# Patient Record
Sex: Female | Born: 1975 | Race: Black or African American | Hispanic: No | Smoking: Current every day smoker
Health system: Southern US, Community
[De-identification: ages and names within clinical notes are randomized; demographics above are authoritative.]

---

## 2004-09-05 ENCOUNTER — Emergency Department (HOSPITAL_COMMUNITY): Admission: EM | Admit: 2004-09-05 | Discharge: 2004-09-05 | Payer: Self-pay | Admitting: Emergency Medicine

## 2005-03-01 ENCOUNTER — Emergency Department (HOSPITAL_COMMUNITY): Admission: EM | Admit: 2005-03-01 | Discharge: 2005-03-01 | Payer: Self-pay | Admitting: Emergency Medicine

## 2005-03-03 ENCOUNTER — Emergency Department (HOSPITAL_COMMUNITY): Admission: EM | Admit: 2005-03-03 | Discharge: 2005-03-03 | Payer: Self-pay | Admitting: Emergency Medicine

## 2007-06-27 ENCOUNTER — Emergency Department (HOSPITAL_COMMUNITY): Admission: EM | Admit: 2007-06-27 | Discharge: 2007-06-27 | Payer: Self-pay | Admitting: Emergency Medicine

## 2007-07-12 ENCOUNTER — Emergency Department (HOSPITAL_COMMUNITY): Admission: EM | Admit: 2007-07-12 | Discharge: 2007-07-13 | Payer: Self-pay | Admitting: Emergency Medicine

## 2007-07-19 ENCOUNTER — Emergency Department (HOSPITAL_COMMUNITY): Admission: EM | Admit: 2007-07-19 | Discharge: 2007-07-19 | Payer: Self-pay | Admitting: Emergency Medicine

## 2007-11-02 ENCOUNTER — Emergency Department (HOSPITAL_COMMUNITY): Admission: EM | Admit: 2007-11-02 | Discharge: 2007-11-02 | Payer: Self-pay | Admitting: Emergency Medicine

## 2010-03-22 ENCOUNTER — Emergency Department (HOSPITAL_COMMUNITY)
Admission: EM | Admit: 2010-03-22 | Discharge: 2010-03-22 | Payer: Self-pay | Source: Home / Self Care | Admitting: Emergency Medicine

## 2010-09-11 ENCOUNTER — Emergency Department (HOSPITAL_COMMUNITY)
Admission: EM | Admit: 2010-09-11 | Discharge: 2010-09-11 | Payer: Self-pay | Source: Home / Self Care | Admitting: Emergency Medicine

## 2010-09-17 ENCOUNTER — Emergency Department (HOSPITAL_COMMUNITY)
Admission: EM | Admit: 2010-09-17 | Discharge: 2010-09-17 | Payer: Self-pay | Source: Home / Self Care | Admitting: Emergency Medicine

## 2010-09-17 LAB — URINALYSIS, ROUTINE W REFLEX MICROSCOPIC
Bilirubin Urine: NEGATIVE
Hgb urine dipstick: NEGATIVE
Ketones, ur: NEGATIVE mg/dL
Nitrite: NEGATIVE
Urobilinogen, UA: 0.2 mg/dL (ref 0.0–1.0)

## 2010-09-17 LAB — DIFFERENTIAL
Basophils Absolute: 0 10*3/uL (ref 0.0–0.1)
Eosinophils Relative: 1 % (ref 0–5)
Lymphocytes Relative: 51 % — ABNORMAL HIGH (ref 12–46)
Monocytes Absolute: 0.5 10*3/uL (ref 0.1–1.0)
Monocytes Relative: 7 % (ref 3–12)

## 2010-09-17 LAB — BASIC METABOLIC PANEL
BUN: 9 mg/dL (ref 6–23)
CO2: 31 mEq/L (ref 19–32)
Calcium: 9 mg/dL (ref 8.4–10.5)
Creatinine, Ser: 0.9 mg/dL (ref 0.4–1.2)
GFR calc non Af Amer: >60 mL/min (ref >60)
Glucose, Bld: 78 mg/dL (ref 70–99)
Sodium: 139 mEq/L (ref 135–145)

## 2010-09-17 LAB — CBC
HCT: 41.3 % (ref 36.0–46.0)
Hemoglobin: 14.2 g/dL (ref 12.0–15.0)
MCH: 30.3 pg (ref 26.0–34.0)
MCHC: 34.4 g/dL (ref 30.0–36.0)
MCV: 88.1 fL (ref 78.0–100.0)
RDW: 12.9 % (ref 11.5–15.5)

## 2010-11-25 ENCOUNTER — Emergency Department (HOSPITAL_COMMUNITY): Payer: BC Managed Care – PPO

## 2010-11-25 ENCOUNTER — Emergency Department (HOSPITAL_COMMUNITY)
Admission: EM | Admit: 2010-11-25 | Discharge: 2010-11-25 | Disposition: A | Discharge status: Home / Self Care | Payer: BC Managed Care – PPO | Attending: Emergency Medicine | Admitting: Emergency Medicine

## 2010-11-25 DIAGNOSIS — R109 Unspecified abdominal pain: Secondary | ICD-10-CM | POA: Insufficient documentation

## 2010-11-25 DIAGNOSIS — N201 Calculus of ureter: Secondary | ICD-10-CM | POA: Insufficient documentation

## 2010-11-25 LAB — COMPREHENSIVE METABOLIC PANEL
Albumin: 3.7 g/dL (ref 3.5–5.2)
Alkaline Phosphatase: 62 U/L (ref 39–117)
BUN: 11 mg/dL (ref 6–23)
Chloride: 101 mEq/L (ref 96–112)
Creatinine, Ser: 1.06 mg/dL (ref 0.4–1.2)
Glucose, Bld: 112 mg/dL — ABNORMAL HIGH (ref 70–99)
Potassium: 3.7 mEq/L (ref 3.5–5.1)
Total Bilirubin: 0.5 mg/dL (ref 0.3–1.2)

## 2010-11-25 LAB — DIFFERENTIAL
Eosinophils Absolute: 0 10*3/uL (ref 0.0–0.7)
Eosinophils Relative: 1 % (ref 0–5)
Lymphocytes Relative: 32 % (ref 12–46)
Lymphs Abs: 2 10*3/uL (ref 0.7–4.0)
Monocytes Relative: 8 % (ref 3–12)

## 2010-11-25 LAB — CBC
HCT: 46 % (ref 36.0–46.0)
MCH: 30.2 pg (ref 26.0–34.0)
MCV: 90.2 fL (ref 78.0–100.0)
Platelets: 222 10*3/uL (ref 150–400)
RBC: 5.1 MIL/uL (ref 3.87–5.11)
RDW: 12.6 % (ref 11.5–15.5)
WBC: 6.2 10*3/uL (ref 4.0–10.5)

## 2010-11-25 LAB — URINALYSIS, ROUTINE W REFLEX MICROSCOPIC
Leukocytes, UA: NEGATIVE
Nitrite: NEGATIVE
Protein, ur: NEGATIVE mg/dL
Specific Gravity, Urine: >1.03 — ABNORMAL HIGH (ref 1.005–1.030)
Urobilinogen, UA: 0.2 mg/dL (ref 0.0–1.0)

## 2010-11-25 LAB — URINE MICROSCOPIC-ADD ON

## 2010-11-25 LAB — POCT PREGNANCY, URINE: Preg Test, Ur: NEGATIVE

## 2010-12-03 ENCOUNTER — Ambulatory Visit (HOSPITAL_COMMUNITY)
Admission: RE | Admit: 2010-12-03 | Discharge: 2010-12-03 | Disposition: A | Discharge status: Home / Self Care | Payer: BC Managed Care – PPO | Source: Ambulatory Visit | Attending: Urology | Admitting: Urology

## 2010-12-03 ENCOUNTER — Other Ambulatory Visit (HOSPITAL_COMMUNITY): Payer: Self-pay | Admitting: Urology

## 2010-12-03 DIAGNOSIS — R109 Unspecified abdominal pain: Secondary | ICD-10-CM | POA: Insufficient documentation

## 2010-12-03 DIAGNOSIS — N201 Calculus of ureter: Secondary | ICD-10-CM

## 2011-05-13 LAB — URINALYSIS, ROUTINE W REFLEX MICROSCOPIC
Bilirubin Urine: NEGATIVE
Glucose, UA: NEGATIVE
Hgb urine dipstick: NEGATIVE
Ketones, ur: 15 — AB
pH: 7

## 2011-05-13 LAB — URINE MICROSCOPIC-ADD ON

## 2013-01-15 IMAGING — CT CT ABD-PELV W/O CM
2 of 3 series · 18 of 46 positions shown, 20 images · non-contrast
Comparison: None

CLINICAL DATA: Right flank pain and hematuria

CT ABDOMEN AND PELVIS WITHOUT CONTRAST
TECHNIQUE: Multidetector CT imaging of the abdomen and pelvis was
performed following the standard protocol without intravenous
contrast.

[Series 2: standard/full over (age)lbs 5.0 · axial · 0.84mm/px · z∈[-412,-18]mm · 15 of 88 slices shown, 17 images]
[im 6/88  soft-tissue]
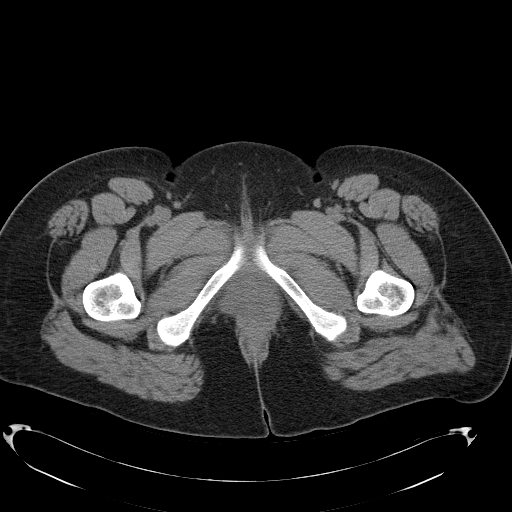
[im 6/88  bone]
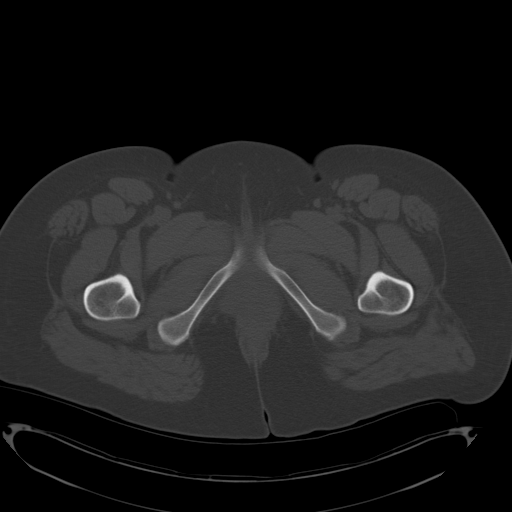
[im 12/88  soft-tissue]
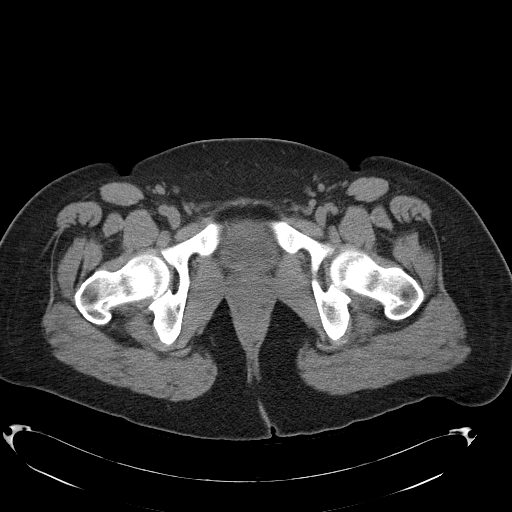
[im 17/88  soft-tissue]
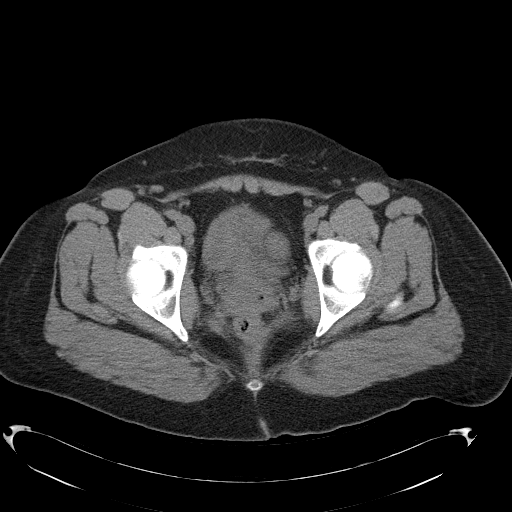
[im 23/88  soft-tissue]
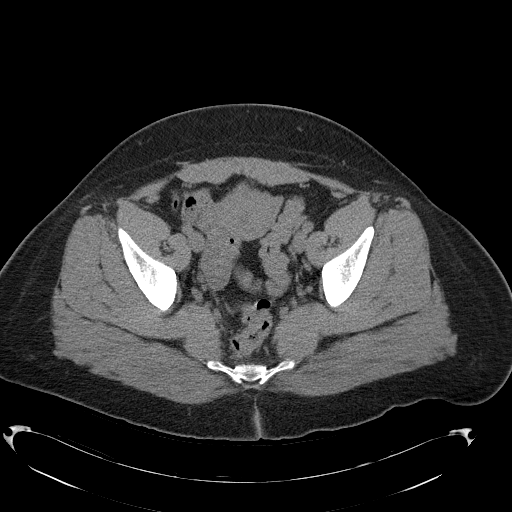
[im 29/88  soft-tissue]
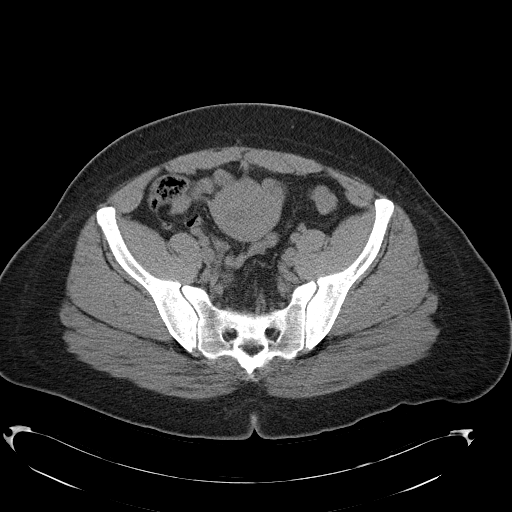
[im 34/88  soft-tissue]
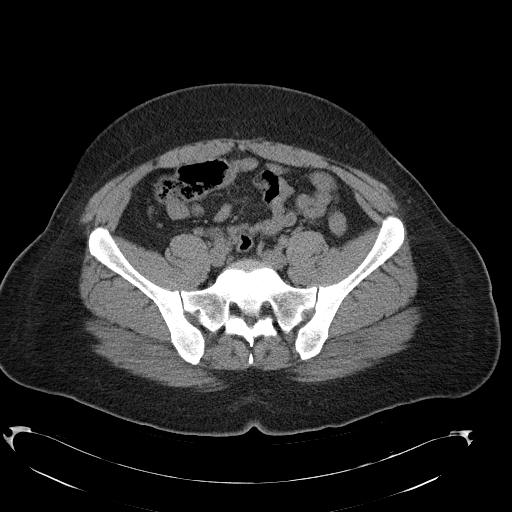
[im 40/88  soft-tissue]
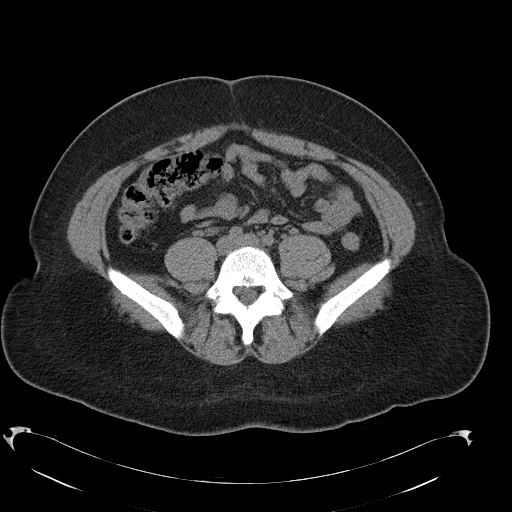
[im 45/88  soft-tissue]
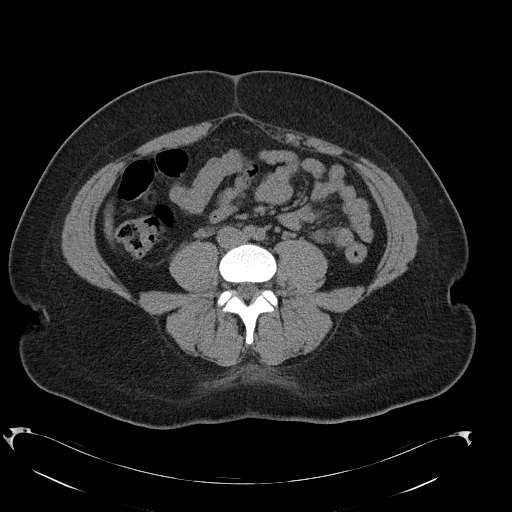
[im 51/88  soft-tissue]
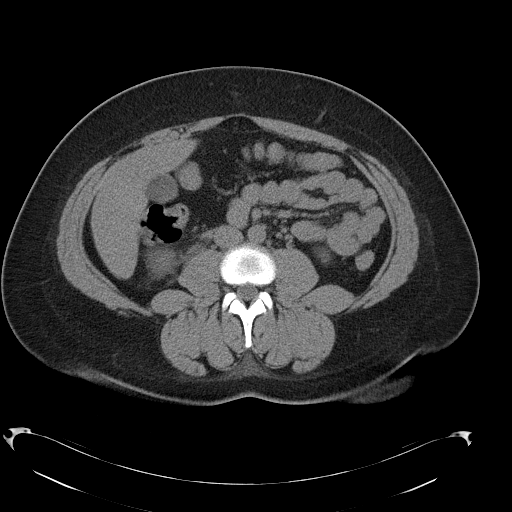
[im 51/88  bone]
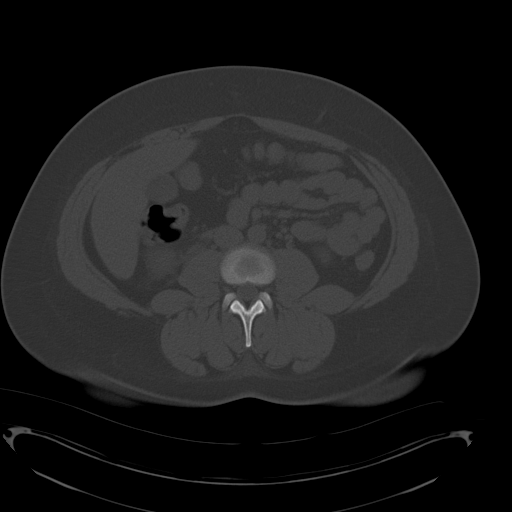
[im 57/88  soft-tissue]
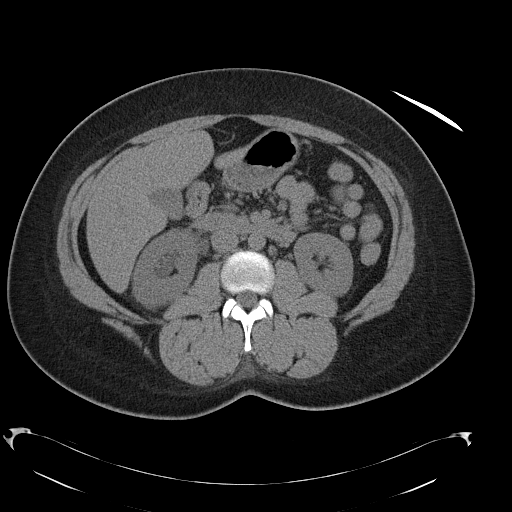
[im 62/88  soft-tissue]
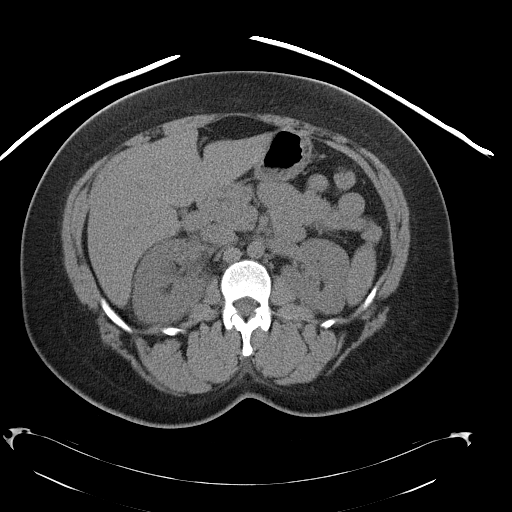
[im 68/88  soft-tissue]
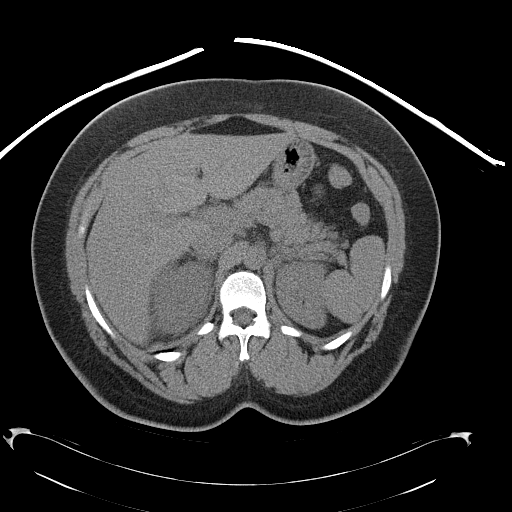
[im 73/88  soft-tissue]
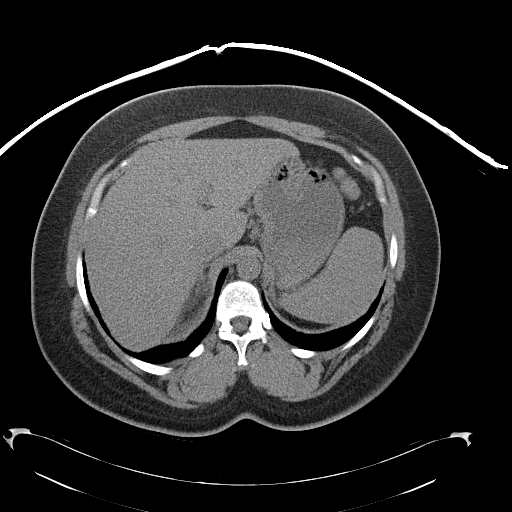
[im 79/88  soft-tissue]
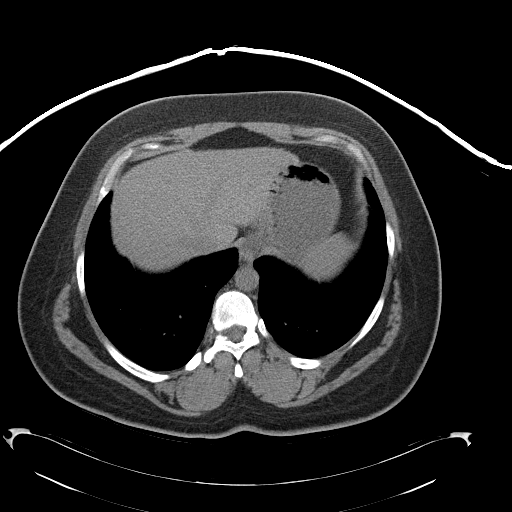
[im 85/88  soft-tissue]
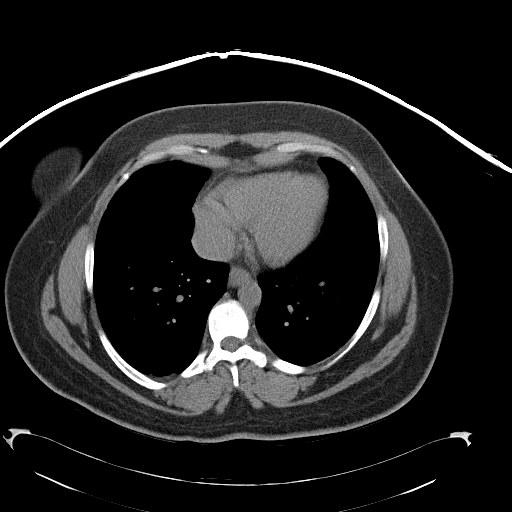

[Series 4: mpr coronal · coronal · 0.86mm/px · 3 of 86 slices shown]
[im 29/86  soft-tissue]
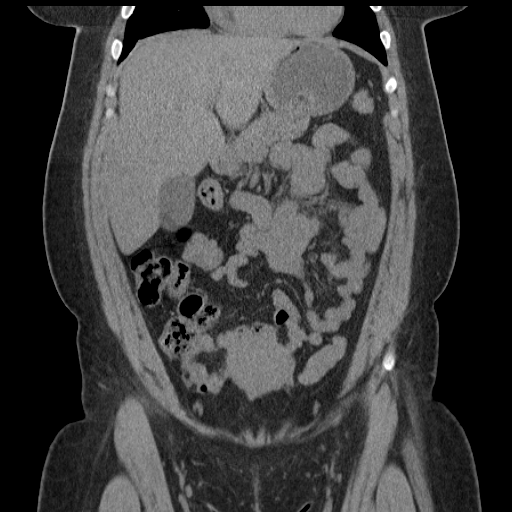
[im 38/86  soft-tissue]
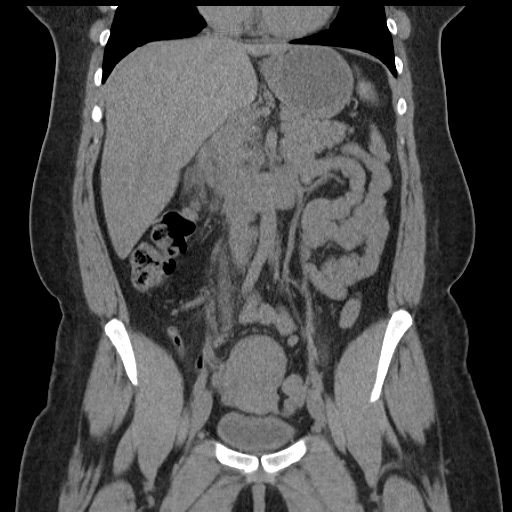
[im 48/86  soft-tissue]
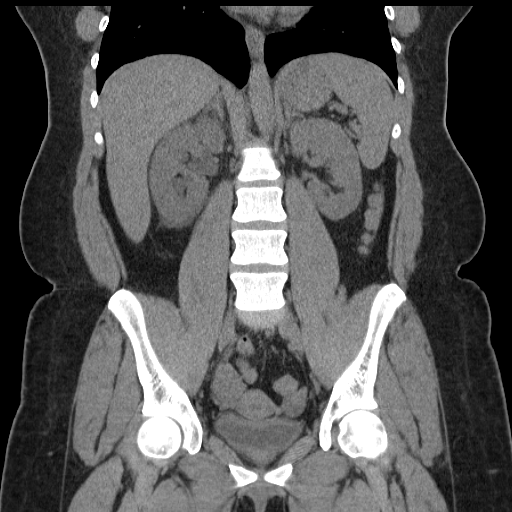

[18 of 46 positions shown; findings below may reference images not displayed]

FINDINGS: Negative for renal calculi.  Mild right hydronephrosis
and perinephric edema due to a distal ureteral stone.  3 x 4 mm
stone at the right UVJ causing obstruction.  No calculi on the
left.

Lung bases are clear.  Liver gallbladder and bile ducts are normal.
Pancreas and spleen are normal.  The bowel appears normal.
Appendix is normal.
IMPRESSION: 3 x 4 mm stone in the distal right ureter causing partial
obstruction of the right kidney.  Otherwise negative.

## 2013-01-23 IMAGING — CR DG ABDOMEN 1V
2 series · 2 of 2 positions shown · non-contrast
Comparison: CT 11/25/2010

CLINICAL DATA: Right ureteral calculus at ureterovesicle junction,
follow-up

ABDOMEN - 1 VIEW

[view not recorded (1 of 2)]
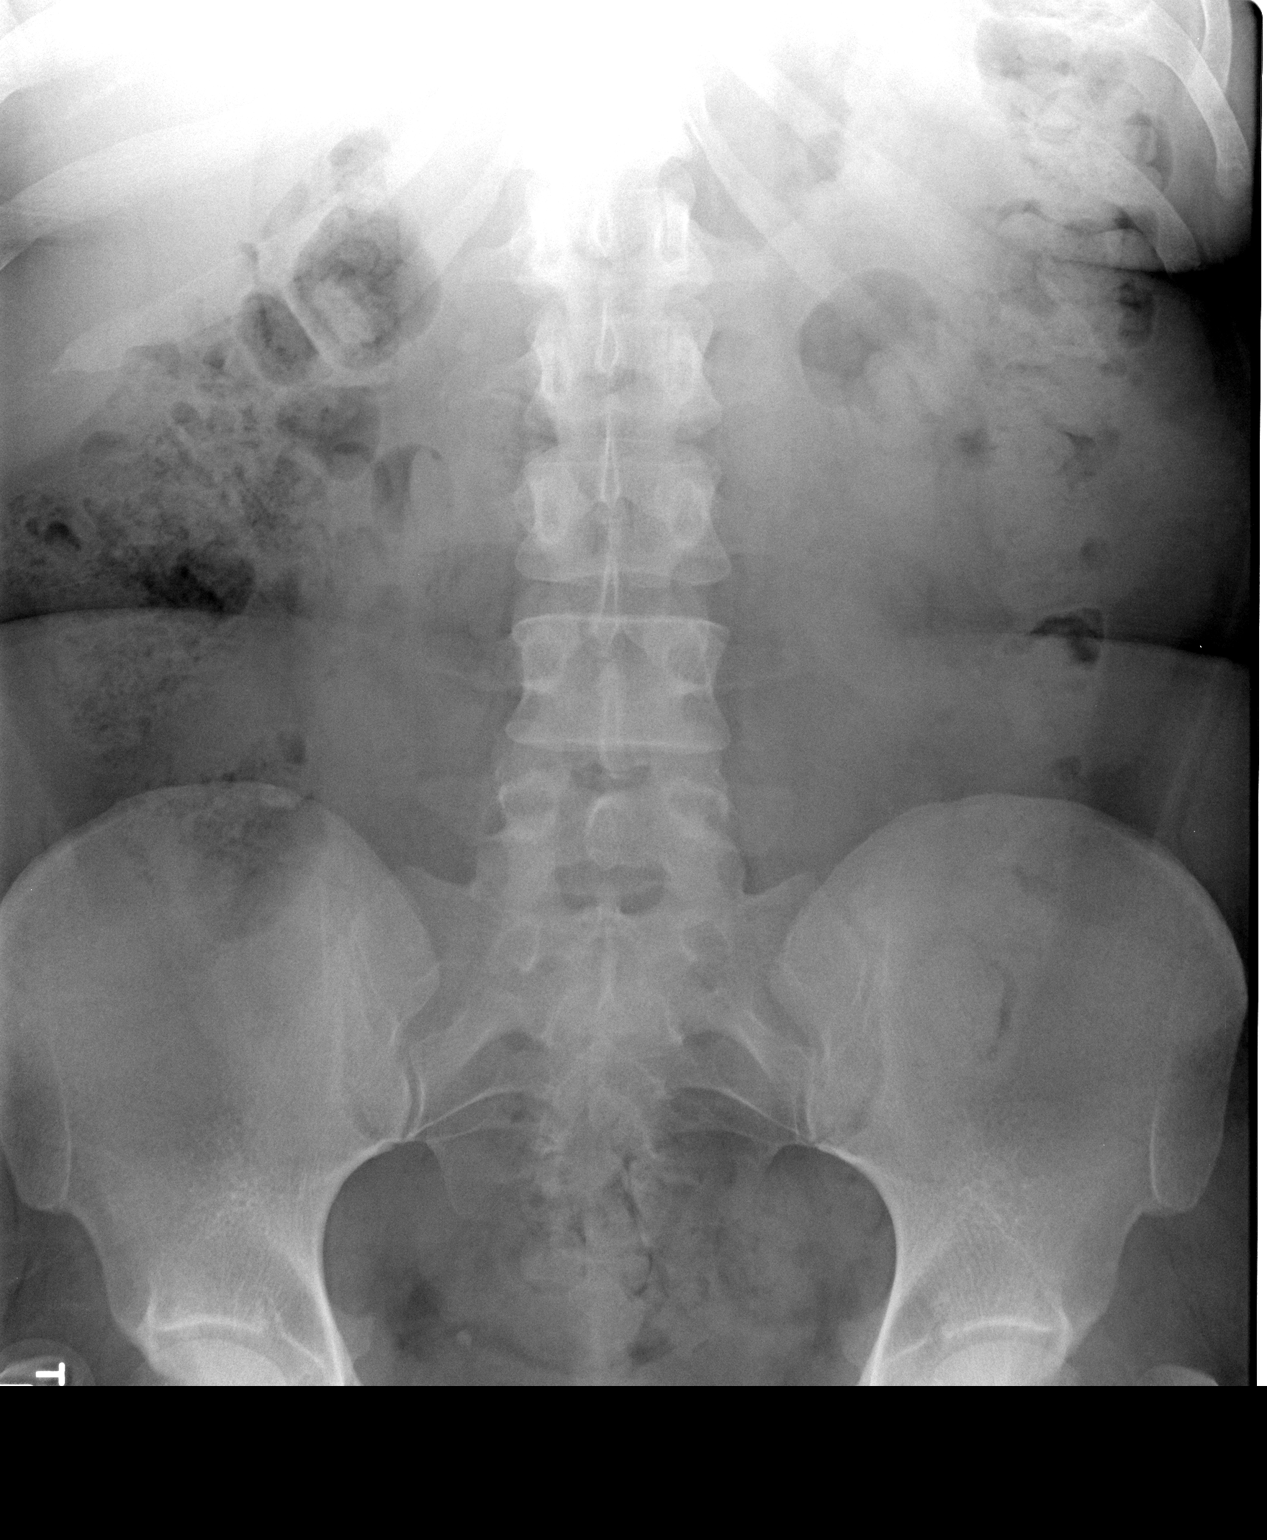

[view not recorded (2 of 2)]
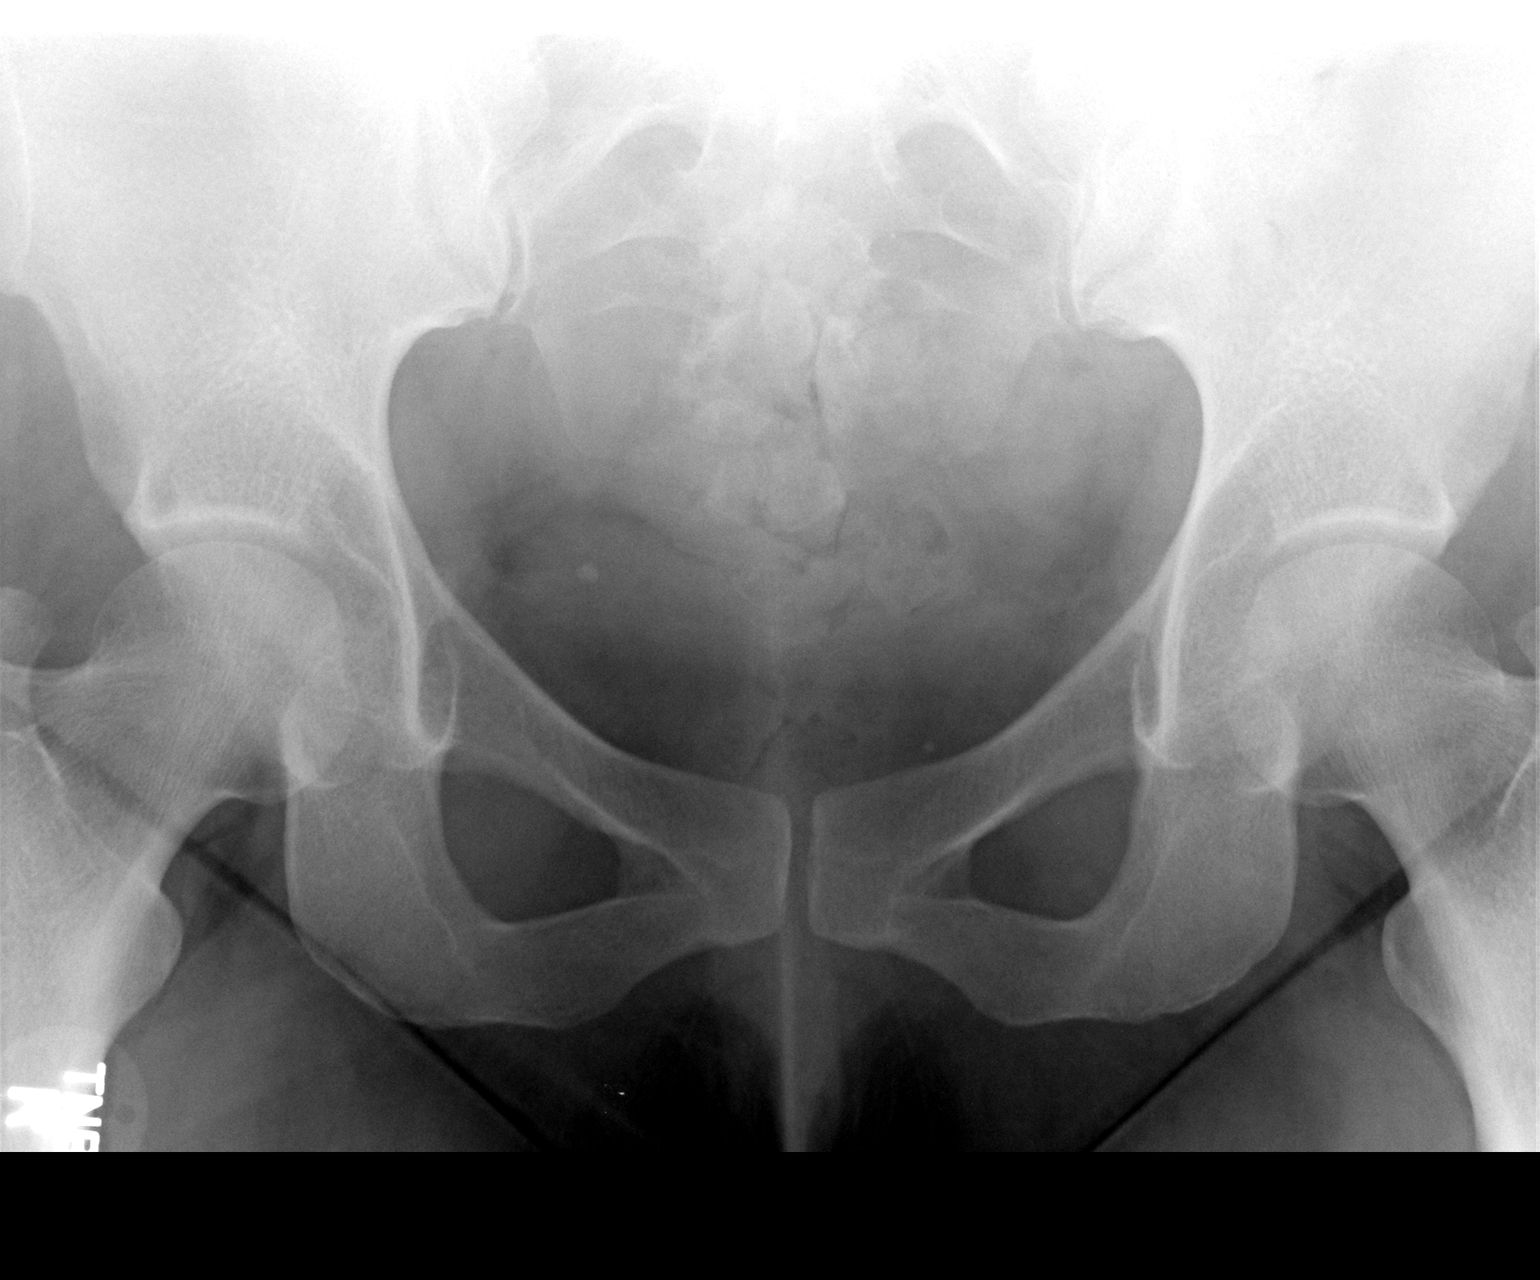

[2 of 2 positions shown; findings below may reference images not displayed]

FINDINGS: Right pelvic phleboliths seen on prior CT is again identified on
radiograph.
Distal right ureteral calcification seen on prior CT scout image is
no longer visualized.
Tiny left pelvic phlebolith is stable.
No definite urinary tract calcification identified.
Minimally increased stool in colon.
Small bowel gas pattern normal.
No acute osseous abnormalities.
IMPRESSION: Previously identified distal right ureteral calculus at right
ureterovesicle junction is no longer seen.

## 2017-03-01 ENCOUNTER — Encounter (HOSPITAL_COMMUNITY): Payer: Self-pay | Admitting: Emergency Medicine

## 2017-03-01 ENCOUNTER — Emergency Department (HOSPITAL_COMMUNITY)
Admission: EM | Admit: 2017-03-01 | Discharge: 2017-03-01 | Disposition: A | Discharge status: Home / Self Care | Payer: Managed Care, Other (non HMO) | Attending: Emergency Medicine | Admitting: Emergency Medicine

## 2017-03-01 DIAGNOSIS — Y92009 Unspecified place in unspecified non-institutional (private) residence as the place of occurrence of the external cause: Secondary | ICD-10-CM | POA: Insufficient documentation

## 2017-03-01 DIAGNOSIS — W0110XA Fall on same level from slipping, tripping and stumbling with subsequent striking against unspecified object, initial encounter: Secondary | ICD-10-CM | POA: Insufficient documentation

## 2017-03-01 DIAGNOSIS — Y939 Activity, unspecified: Secondary | ICD-10-CM | POA: Diagnosis not present

## 2017-03-01 DIAGNOSIS — S01112A Laceration without foreign body of left eyelid and periocular area, initial encounter: Secondary | ICD-10-CM | POA: Diagnosis present

## 2017-03-01 DIAGNOSIS — F17210 Nicotine dependence, cigarettes, uncomplicated: Secondary | ICD-10-CM | POA: Diagnosis not present

## 2017-03-01 DIAGNOSIS — Y999 Unspecified external cause status: Secondary | ICD-10-CM | POA: Insufficient documentation

## 2017-03-01 DIAGNOSIS — H1132 Conjunctival hemorrhage, left eye: Secondary | ICD-10-CM | POA: Insufficient documentation

## 2017-03-01 MED ORDER — FLUORESCEIN SODIUM 0.6 MG OP STRP
ORAL_STRIP | OPHTHALMIC | Status: AC
  Administered 2017-03-01: 1 via OPHTHALMIC
  Filled 2017-03-01: qty 1

## 2017-03-01 MED ORDER — POVIDONE-IODINE 10 % EX SOLN
CUTANEOUS | Status: AC
  Filled 2017-03-01: qty 118

## 2017-03-01 MED ORDER — TETRACAINE HCL 0.5 % OP SOLN
OPHTHALMIC | Status: AC
  Filled 2017-03-01: qty 4

## 2017-03-01 MED ORDER — IBUPROFEN 600 MG PO TABS: 600.0000 mg | ORAL_TABLET | Freq: Four times a day (QID) | ORAL | 0 refills | Status: AC | PRN

## 2017-03-01 MED ORDER — BACITRACIN ZINC 500 UNIT/GM EX OINT
1.0000 "application " | TOPICAL_OINTMENT | Freq: Two times a day (BID) | CUTANEOUS | Status: DC
  Administered 2017-03-01: 1 via TOPICAL
  Filled 2017-03-01: qty 0.9

## 2017-03-01 MED ORDER — TETRACAINE HCL 0.5 % OP SOLN
1.0000 [drp] | Freq: Once | OPHTHALMIC | Status: AC
  Administered 2017-03-01: 08:00:00 via OPHTHALMIC

## 2017-03-01 MED ORDER — LIDOCAINE HCL (PF) 1 % IJ SOLN
INTRAMUSCULAR | Status: AC
  Filled 2017-03-01: qty 5

## 2017-03-01 MED ORDER — FLUORESCEIN SODIUM 0.6 MG OP STRP
1.0000 | ORAL_STRIP | Freq: Once | OPHTHALMIC | Status: AC
  Administered 2017-03-01: 1 via OPHTHALMIC

## 2017-03-01 NOTE — Discharge Instructions (Signed)

## 2017-03-01 NOTE — ED Triage Notes (Signed)
Pt reports tripping on cover while getting out of bed around 2am and hitting left eye.  Laceration noted.  Denies blurred vision. Deviation of eye normal per pt.

## 2017-03-01 NOTE — ED Provider Notes (Signed)
[facility] Provider Note   CSN: [id number] Arrival date & time: 03/01/17  0725     History   Chief Complaint Chief Complaint  Patient presents with  . Eye Injury    HPI Heather Stone is a 41 y.o. female.  HPI  41 year old female, awoke at approximately 1 AM when she tripped and fell striking her left periorbital face on something sharp that she fell to the ground. She had a laceration to the left eyelid just caudad to the eyebrow, minimal bleeding, minimal pain, no changes in vision. She presents for evaluation of this injury. It is persistent, mild, associated with a laceration. She denies any visual changes numbness headache or vomiting.  History reviewed. No pertinent past medical history.  There are no active problems to display for this patient.   History reviewed. No pertinent surgical history.  OB History    No data available       Home Medications    Prior to Admission medications   Not on File    Family History History reviewed. No pertinent family history.  Social History Social History  Substance Use Topics  . Smoking status: Current Every Day Smoker    Packs/day: 0.50    Types: Cigarettes  . Smokeless tobacco: Not on file  . Alcohol use No     Allergies   Patient has no known allergies.   Review of Systems Review of Systems  Eyes: Positive for pain and redness. Negative for visual disturbance.  Skin: Positive for wound.     Physical Exam Updated Vital Signs BP (!) 150/97   Pulse 64   Temp 97.8 F (36.6 C) (Oral)   Resp 16   Ht 5\' 5"  (1.651 m)   Wt 86.2 kg (190 lb)   LMP 02/22/2017   SpO2 100%   BMI 31.62 kg/m   Physical Exam  Constitutional: She appears well-developed and well-nourished.  HENT:  Head: Normocephalic.  Mild periorbital bruising, small laceration above the left eyelid approximately 1.5 cm  Eyes: Conjunctivae are normal. Right eye exhibits no discharge. Left eye exhibits no discharge.  Normal  pupillary exam bilaterally, left eye with some wandering which is baseline for the patient. Conjunctivae are clear bilaterally though there is some subconjunctival hemorrhage on the left, no tenderness over the orbital rim  Tetracaine and floor seen exam with Wood's lamp shows no signs of some conjunctival injury, corneal injury, conjunctival laceration abrasion or tear  Pulmonary/Chest: Effort normal. No respiratory distress.  Neurological: She is alert. Coordination normal.  Normal speech, gait, coordination  Skin: Skin is warm and dry. No rash noted. She is not diaphoretic. No erythema.  Psychiatric: She has a normal mood and affect.  Nursing note and vitals reviewed.    ED Treatments / Results  Labs (all labs ordered are listed, but only abnormal results are displayed) Labs Reviewed - No data to display   Radiology No results found.  Procedures Procedures (including critical care time)  Medications Ordered in ED Medications  tetracaine (PONTOCAINE) 0.5 % ophthalmic solution 1 drop (not administered)  fluorescein ophthalmic strip 1 strip (not administered)  tetracaine (PONTOCAINE) 0.5 % ophthalmic solution (  Given by Other 03/01/17 0804)  fluorescein 0.6 MG ophthalmic strip (1 strip  Given by Other 03/01/17 0804)     Initial Impression / Assessment and Plan / ED Course  I have reviewed the triage vital signs and the nursing notes.  Pertinent labs & imaging results that were available during my care  of the patient were reviewed by me and considered in my medical decision making (see chart for details).  LACERATION REPAIR Performed by: Vida Roller Authorized by: Vida Roller Consent: Verbal consent obtained. Risks and benefits: risks, benefits and alternatives were discussed Consent given by: patient Patient identity confirmed: provided demographic data Prepped and Draped in normal sterile fashion Wound explored  Laceration Location: Left eyelid just caudad to  eyebrow   Laceration Length: 1.5 cm  No Foreign Bodies seen or palpated  Anesthesia: local infiltration  Local anesthetic: lidocaine 1 % with epinephrine  Anesthetic total: 1 ml  Irrigation method: syringe Amount of cleaning: standard  Skin closure: 6-0 Prolene   Number of sutures: 3   Technique: Simple interrupted   Patient tolerance: Patient tolerated the procedure well with no immediate complications.   Tetracaine, fluorescein, primary repair. Update tetanus as needed  Final Clinical Impressions(s) / ED Diagnoses   Final diagnoses:  Laceration of skin of left eyelid, initial encounter  Subconjunctival bleed, left    New Prescriptions New Prescriptions   No medications on file     Eber Hong, MD 03/01/17 [phone]

## 2017-03-07 ENCOUNTER — Encounter (HOSPITAL_COMMUNITY): Payer: Self-pay | Admitting: Emergency Medicine

## 2017-03-07 ENCOUNTER — Emergency Department (HOSPITAL_COMMUNITY)
Admission: EM | Admit: 2017-03-07 | Discharge: 2017-03-07 | Disposition: A | Discharge status: Home / Self Care | Payer: Managed Care, Other (non HMO) | Attending: Emergency Medicine | Admitting: Emergency Medicine

## 2017-03-07 DIAGNOSIS — Z4802 Encounter for removal of sutures: Secondary | ICD-10-CM | POA: Diagnosis not present

## 2017-03-07 NOTE — ED Provider Notes (Signed)
[facility] Provider Note   CSN: [id number] Arrival date & time: 03/07/17  1037     History   Chief Complaint Chief Complaint  Patient presents with  . Suture / Staple Removal    HPI Heather Stone is a 41 y.o. female presenting for suture removal.  She was seen here 6 days ago for laceration to her left periorbital area after tripping injury. She denies any problems or concerns with her wound, no drainage, swelling or increased pain.  The history is provided by the patient.    History reviewed. No pertinent past medical history.  There are no active problems to display for this patient.   History reviewed. No pertinent surgical history.  OB History    No data available       Home Medications    Prior to Admission medications   Medication Sig Start Date End Date Taking? Authorizing Provider  ibuprofen (ADVIL,MOTRIN) 600 MG tablet Take 1 tablet (600 mg total) by mouth every 6 (six) hours as needed. 03/01/17   Eber HongMiller, Brian, MD    Family History History reviewed. No pertinent family history.  Social History Social History  Substance Use Topics  . Smoking status: Current Every Day Smoker    Packs/day: 0.50    Types: Cigarettes  . Smokeless tobacco: Not on file  . Alcohol use No     Allergies   Patient has no known allergies.   Review of Systems Review of Systems  Skin: Positive for wound.  All other systems reviewed and are negative.    Physical Exam Updated Vital Signs Ht 5\' 5"  (1.651 m)   Wt 86.2 kg (190 lb)   LMP 02/22/2017   BMI 31.62 kg/m   Physical Exam  Constitutional: She is oriented to person, place, and time. She appears well-developed and well-nourished.  HENT:  Head: Normocephalic.  Cardiovascular: Normal rate.   Pulmonary/Chest: Effort normal.  Neurological: She is alert and oriented to person, place, and time.  Skin: Laceration noted.  Well healing laceration left eyelid/brow.  No erythema, no edema.     ED  Treatments / Results  Labs (all labs ordered are listed, but only abnormal results are displayed) Labs Reviewed - No data to display  EKG  EKG Interpretation None       Radiology No results found.  Procedures Procedures (including critical care time)  SUTURE REMOVAL Performed by: Burgess AmorIDOL, Kara Mierzejewski  Consent: Verbal consent obtained. Patient identity confirmed: provided demographic data Time out: Immediately prior to procedure a "time out" was called to verify the correct patient, procedure, equipment, support staff and site/side marked as required.  Location details: left eyelid/brow  Wound Appearance: clean  Sutures/Staples Removed: #3 stitches  Facility: sutures placed in this facility Patient tolerance: Patient tolerated the procedure well with no immediate complications.     Medications Ordered in ED Medications - No data to display   Initial Impression / Assessment and Plan / ED Course  I have reviewed the triage vital signs and the nursing notes.  Pertinent labs & imaging results that were available during my care of the patient were reviewed by me and considered in my medical decision making (see chart for details).     Prn f/u anticipated.  Final Clinical Impressions(s) / ED Diagnoses   Final diagnoses:  Visit for suture removal    New Prescriptions New Prescriptions   No medications on file     Victoriano Laindol, Suhayla Chisom, PA-C 03/07/17 1702    Mesner, Barbara CowerJason, MD  03/10/17 1018  

## 2017-03-07 NOTE — ED Triage Notes (Signed)
Here for suture removal

## 2023-08-21 ENCOUNTER — Emergency Department (HOSPITAL_COMMUNITY)
Admission: EM | Admit: 2023-08-21 | Discharge: 2023-08-21 | Disposition: A | Discharge status: Home / Self Care | Payer: BC Managed Care – PPO | Attending: Emergency Medicine | Admitting: Emergency Medicine

## 2023-08-21 ENCOUNTER — Encounter (HOSPITAL_COMMUNITY): Payer: Self-pay | Admitting: Emergency Medicine

## 2023-08-21 ENCOUNTER — Other Ambulatory Visit: Payer: Self-pay

## 2023-08-21 DIAGNOSIS — J101 Influenza due to other identified influenza virus with other respiratory manifestations: Secondary | ICD-10-CM | POA: Diagnosis not present

## 2023-08-21 DIAGNOSIS — Z720 Tobacco use: Secondary | ICD-10-CM | POA: Diagnosis not present

## 2023-08-21 DIAGNOSIS — J111 Influenza due to unidentified influenza virus with other respiratory manifestations: Secondary | ICD-10-CM

## 2023-08-21 DIAGNOSIS — Z20822 Contact with and (suspected) exposure to covid-19: Secondary | ICD-10-CM | POA: Insufficient documentation

## 2023-08-21 DIAGNOSIS — R059 Cough, unspecified: Secondary | ICD-10-CM | POA: Diagnosis present

## 2023-08-21 LAB — RESP PANEL BY RT-PCR (RSV, FLU A&B, COVID)  RVPGX2
Influenza A by PCR: POSITIVE — AB
Influenza B by PCR: NEGATIVE
Resp Syncytial Virus by PCR: NEGATIVE
SARS Coronavirus 2 by RT PCR: NEGATIVE

## 2023-08-21 MED ORDER — IBUPROFEN 400 MG PO TABS
400.0000 mg | ORAL_TABLET | Freq: Once | ORAL | Status: AC
  Administered 2023-08-21: 400 mg via ORAL
  Filled 2023-08-21: qty 1

## 2023-08-21 NOTE — ED Triage Notes (Signed)
 Pt with c/o headache, nausea, chills, and cough that started yesterday.

## 2023-08-21 NOTE — ED Provider Notes (Signed)
 [place] EMERGENCY DEPARTMENT AT [facility] Provider Note   CSN: [id number] Arrival date & time: 08/21/23  0348     History  Chief Complaint  Patient presents with   Headache   Cough   Nausea   Chills    PANHIA KARL is a 48 y.o. female.  The history is provided by the patient and a parent.      Patient without any other health issues presents with flulike illness.  She reports over the past 24 hours she has had headache, cough, chills and diffuse bodyaches.  No vomiting or diarrhea.  She reports intermittent chills as well as diaphoresis. She reports sick contacts with someone with influenza Patient is a current smoker but has not smoked in the past 24 hours No chest pain or shortness of breath is reported Home Medications Prior to Admission medications   Medication Sig Start Date End Date Taking? Authorizing Provider  ibuprofen  (ADVIL ,MOTRIN ) 600 MG tablet Take 1 tablet (600 mg total) by mouth every 6 (six) hours as needed. 03/01/17   Cleotilde Rogue, MD      Allergies    Patient has no known allergies.    Review of Systems   Review of Systems  Constitutional:  Positive for chills, diaphoresis, fatigue and fever.  Cardiovascular:  Negative for chest pain.  Gastrointestinal:  Negative for vomiting.  Neurological:  Positive for headaches.    Physical Exam Updated Vital Signs BP (!) 149/100 (BP Location: Left Arm)   Pulse (!) 109   Temp (!) 101.7 F (38.7 C) (Oral)   Resp 20   Ht 1.651 m (5' 5)   Wt 81.6 kg   LMP 08/14/2023   SpO2 97%   BMI 29.95 kg/m  Physical Exam CONSTITUTIONAL: Well developed/well nourished HEAD: Normocephalic/atraumatic EYES: EOMI ENMT: Mucous membranes moist, uvula midline, no angioedema, no stridor or drooling NECK: supple no meningeal signs CV: S1/S2 noted, tachycardia LUNGS: Lungs are clear to auscultation bilaterally, no apparent distress ABDOMEN: soft NEURO: Pt is awake/alert/appropriate, moves all  extremitiesx4.  No facial droop.   EXTREMITIES:  full ROM SKIN: warm, color normal PSYCH: Mildly anxious  ED Results / Procedures / Treatments   Labs (all labs ordered are listed, but only abnormal results are displayed) Labs Reviewed  RESP PANEL BY RT-PCR (RSV, FLU A&B, COVID)  [id number]    EKG None  Radiology No results found.  Procedures Procedures    Medications Ordered in ED Medications  ibuprofen  (ADVIL ) tablet 400 mg (400 mg Oral Given 08/21/23 0404)    ED Course/ Medical Decision Making/ A&P                                 Medical Decision Making Risk Prescription drug management.   This patient presents to the ED for concern of cough and bodyaches and headache, this involves an extensive number of treatment options, and is a complaint that carries with it a high risk of complications and morbidity.  The differential diagnosis includes but is not limited to influenza, COVID-19, pneumonia, CHF   Social Determinants of Health: Patient's  tobacco use   increases the complexity of managing their presentation  Additional history obtained: Additional history obtained from family  Cardiac Monitoring: The patient was maintained on a cardiac monitor.  I personally viewed and interpreted the cardiac monitor which showed an underlying rhythm of:  sinus tachycardia  Medicines ordered and prescription drug management:  I ordered medication including ibuProfen  for fever   Test Considered: I considered x-ray, patient has no added lung sounds and no hypoxia  Complexity of problems addressed: Patient's presentation is most consistent with  acute illness/injury with systemic symptoms  Disposition: After consideration of the diagnostic results and the patient's response to treatment,  I feel that the patent would benefit from discharge   .    Patient with fever, body aches, cough and headache.  She is in no acute distress, no respiratory stress or hypoxia Strong  suspicion this represents influenza Patient has no other health conditions, would not start Tamiflu  Encouraged to quit smoking. We discussed strict  return precautions       Final Clinical Impression(s) / ED Diagnoses Final diagnoses:  Influenza-like illness    Rx / DC Orders ED Discharge Orders     None         Midge Golas, MD 08/21/23 [phone]
